# Patient Record
Sex: Male | Born: 1975 | Race: Black or African American | Hispanic: No | Marital: Single | Smoking: Current some day smoker
Health system: Southern US, Community
[De-identification: ages and names within clinical notes are randomized; demographics above are authoritative.]

---

## 2018-08-27 ENCOUNTER — Emergency Department (HOSPITAL_COMMUNITY): Payer: Self-pay

## 2018-08-27 ENCOUNTER — Emergency Department (HOSPITAL_COMMUNITY)
Admission: EM | Admit: 2018-08-27 | Discharge: 2018-08-27 | Disposition: A | Discharge status: Home / Self Care | Payer: Self-pay | Attending: Emergency Medicine | Admitting: Emergency Medicine

## 2018-08-27 ENCOUNTER — Encounter (HOSPITAL_COMMUNITY): Payer: Self-pay

## 2018-08-27 ENCOUNTER — Other Ambulatory Visit: Payer: Self-pay

## 2018-08-27 DIAGNOSIS — Y9389 Activity, other specified: Secondary | ICD-10-CM | POA: Insufficient documentation

## 2018-08-27 DIAGNOSIS — R52 Pain, unspecified: Secondary | ICD-10-CM

## 2018-08-27 DIAGNOSIS — S66911A Strain of unspecified muscle, fascia and tendon at wrist and hand level, right hand, initial encounter: Secondary | ICD-10-CM | POA: Diagnosis not present

## 2018-08-27 DIAGNOSIS — Y92410 Unspecified street and highway as the place of occurrence of the external cause: Secondary | ICD-10-CM | POA: Diagnosis not present

## 2018-08-27 DIAGNOSIS — S39012A Strain of muscle, fascia and tendon of lower back, initial encounter: Secondary | ICD-10-CM | POA: Insufficient documentation

## 2018-08-27 DIAGNOSIS — S3992XA Unspecified injury of lower back, initial encounter: Secondary | ICD-10-CM | POA: Diagnosis present

## 2018-08-27 DIAGNOSIS — F17200 Nicotine dependence, unspecified, uncomplicated: Secondary | ICD-10-CM | POA: Insufficient documentation

## 2018-08-27 DIAGNOSIS — Y998 Other external cause status: Secondary | ICD-10-CM | POA: Insufficient documentation

## 2018-08-27 MED ORDER — NAPROXEN 500 MG PO TABS: 500.0000 mg | ORAL_TABLET | Freq: Two times a day (BID) | ORAL | 0 refills | Status: AC

## 2018-08-27 MED ORDER — CYCLOBENZAPRINE HCL 10 MG PO TABS: 10.0000 mg | ORAL_TABLET | Freq: Two times a day (BID) | ORAL | 0 refills | Status: AC | PRN

## 2018-08-27 NOTE — ED Notes (Signed)
Pt returned to room  

## 2018-08-27 NOTE — Discharge Instructions (Signed)
Thank you for allowing me to care for you today. Please return to the emergency department if you have new or worsening symptoms. Take your medications as instructed.  ° °

## 2018-08-27 NOTE — ED Notes (Signed)
Patient transported to X-ray 

## 2018-08-27 NOTE — ED Notes (Signed)
Patient verbalizes understanding of discharge instructions. Opportunity for questioning and answers were provided. Armband removed by staff, pt discharged from ED ambulatory.   
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
## 2023-04-19 ENCOUNTER — Ambulatory Visit
Admission: EM | Admit: 2023-04-19 | Discharge: 2023-04-19 | Disposition: A | Discharge status: Home / Self Care | Payer: BLUE CROSS/BLUE SHIELD | Attending: Internal Medicine | Admitting: Internal Medicine

## 2023-04-19 DIAGNOSIS — R300 Dysuria: Secondary | ICD-10-CM

## 2023-04-19 DIAGNOSIS — R369 Urethral discharge, unspecified: Secondary | ICD-10-CM

## 2023-04-19 DIAGNOSIS — Z113 Encounter for screening for infections with a predominantly sexual mode of transmission: Secondary | ICD-10-CM | POA: Diagnosis not present

## 2023-04-19 LAB — POCT URINALYSIS DIP (MANUAL ENTRY)
Bilirubin, UA: NEGATIVE
Blood, UA: NEGATIVE
Glucose, UA: NEGATIVE mg/dL
Ketones, POC UA: NEGATIVE mg/dL
Nitrite, UA: NEGATIVE
Protein Ur, POC: NEGATIVE mg/dL
Spec Grav, UA: >=1.03 — AB (ref 1.010–1.025)
Urobilinogen, UA: 0.2 U/dL
pH, UA: 6 (ref 5.0–8.0)

## 2023-04-19 NOTE — ED Triage Notes (Signed)
Presents states he would like STI checkup, discharge and tingling at the head of penis. Symptoms x 1 day.

## 2023-04-19 NOTE — ED Provider Notes (Signed)
[facility]    CSN: [id number] Arrival date & time: 04/19/23  1245      History   Chief Complaint No chief complaint on file.   HPI Dillon Burns is a 47 y.o. male.   Patient presents today given 1 to 2-day history of penile discharge, tingling sensation of penis, dysuria.  Denies any other associated symptoms.  Denies exposure to STD but has had unprotected sexual intercourse recently.     History reviewed. No pertinent past medical history.  There are no problems to display for this patient.   History reviewed. No pertinent surgical history.     Home Medications    Prior to Admission medications   Medication Sig Start Date End Date Taking? Authorizing Provider  naproxen (NAPROSYN) 500 MG tablet Take 1 tablet (500 mg total) by mouth 2 (two) times daily. 08/27/18   Arlyn Dunning, PA-C    Family History No family history on file.  Social History Social History   Tobacco Use   Smoking status: Some Days   Smokeless tobacco: Former  Building services engineer status: Never Used  Substance Use Topics   Alcohol use: Yes   Drug use: Never     Allergies   Patient has no known allergies.   Review of Systems Review of Systems Per HPI  Physical Exam Triage Vital Signs ED Triage Vitals  Encounter Vitals Group     BP 04/19/23 1341 (!) 146/93     Systolic BP Percentile --      Diastolic BP Percentile --      Pulse Rate 04/19/23 1341 60     Resp 04/19/23 1341 18     Temp 04/19/23 1341 98.6 F (37 C)     Temp Source 04/19/23 1341 Oral     SpO2 04/19/23 1341 98 %     Weight 04/19/23 1339 189 lb (85.7 kg)     Height 04/19/23 1339 5\' 10"  (1.778 m)     Head Circumference --      Peak Flow --      Pain Score 04/19/23 1339 2     Pain Loc --      Pain Education --      Exclude from Growth Chart --    No data found.  Updated Vital Signs BP (!) 146/93 (BP Location: Left Arm)   Pulse 60   Temp 98.6 F (37 C) (Oral)   Resp 18   Ht 5'  10" (1.778 m)   Wt 189 lb (85.7 kg)   SpO2 98%   BMI 27.12 kg/m   Visual Acuity Right Eye Distance:   Left Eye Distance:   Bilateral Distance:    Right Eye Near:   Left Eye Near:    Bilateral Near:     Physical Exam Constitutional:      General: He is not in acute distress.    Appearance: Normal appearance. He is not toxic-appearing or diaphoretic.  HENT:     Head: Normocephalic and atraumatic.  Eyes:     Extraocular Movements: Extraocular movements intact.     Conjunctiva/sclera: Conjunctivae normal.  Pulmonary:     Effort: Pulmonary effort is normal.  Genitourinary:    Comments: Deferred with shared decision making. Self swab performed.  Neurological:     General: No focal deficit present.     Mental Status: He is alert and oriented to person, place, and time. Mental status is at baseline.  Psychiatric:  Mood and Affect: Mood normal.        Behavior: Behavior normal.        Thought Content: Thought content normal.        Judgment: Judgment normal.      UC Treatments / Results  Labs (all labs ordered are listed, but only abnormal results are displayed) Labs Reviewed  POCT URINALYSIS DIP (MANUAL ENTRY) - Abnormal; Notable for the following components:      Result Value   Spec Grav, UA >=1.030 (*)    Leukocytes, UA Trace (*)    All other components within normal limits  URINE CULTURE  RPR  HIV ANTIBODY (ROUTINE TESTING W REFLEX)  CYTOLOGY, (ORAL, ANAL, URETHRAL) ANCILLARY ONLY    EKG   Radiology No results found.  Procedures Procedures (including critical care time)  Medications Ordered in UC Medications - No data to display  Initial Impression / Assessment and Plan / UC Course  I have reviewed the triage vital signs and the nursing notes.  Pertinent labs & imaging results that were available during my care of the patient were reviewed by me and considered in my medical decision making (see chart for details).     UA shows trace  leukocytes so will send urine culture to confirm as there is low suspicion for UTI.  Penile swab pending.  Will await results prior to treatment given no confirmed exposure to STD.  Advised strict follow-up precautions.  Patient verbalized understanding and was agreeable with plan. Final Clinical Impressions(s) / UC Diagnoses   Final diagnoses:  Penile discharge  Dysuria  Screening examination for venereal disease     Discharge Instructions      Urine was clear.  Will send urine for culture as well as penile swab to test for STDs.  Follow-up if any symptoms persist or worsen.    ED Prescriptions   None    PDMP not reviewed this encounter.   Gustavus Bryant, [place] 04/19/23 1416

## 2023-04-19 NOTE — Discharge Instructions (Signed)
Urine was clear.  Will send urine for culture as well as penile swab to test for STDs.  Follow-up if any symptoms persist or worsen.

## 2023-04-20 LAB — CYTOLOGY, (ORAL, ANAL, URETHRAL) ANCILLARY ONLY
Chlamydia: NEGATIVE
Comment: NEGATIVE
Comment: NEGATIVE
Comment: NORMAL
Neisseria Gonorrhea: NEGATIVE
Trichomonas: NEGATIVE

## 2023-04-20 LAB — URINE CULTURE: Culture: NO GROWTH

## 2023-04-20 LAB — HIV ANTIBODY (ROUTINE TESTING W REFLEX): HIV Screen 4th Generation wRfx: NONREACTIVE

## 2023-04-20 LAB — RPR: RPR Ser Ql: NONREACTIVE
